# Patient Record
Sex: Female | Born: 1985 | Race: Black or African American | Hispanic: No | Marital: Single | Smoking: Current every day smoker
Health system: Southern US, Community
[De-identification: ages and names within clinical notes are randomized; demographics above are authoritative.]

## PROBLEM LIST (undated history)

## (undated) DIAGNOSIS — I10 Essential (primary) hypertension: Secondary | ICD-10-CM

---
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
## 2005-11-15 ENCOUNTER — Emergency Department (HOSPITAL_COMMUNITY): Admission: EM | Admit: 2005-11-15 | Discharge: 2005-11-15 | Payer: Self-pay | Admitting: Emergency Medicine

## 2005-11-16 ENCOUNTER — Emergency Department (HOSPITAL_COMMUNITY): Admission: EM | Admit: 2005-11-16 | Discharge: 2005-11-16 | Payer: Self-pay | Admitting: Emergency Medicine

## 2005-11-18 ENCOUNTER — Emergency Department (HOSPITAL_COMMUNITY): Admission: EM | Admit: 2005-11-18 | Discharge: 2005-11-18 | Payer: Self-pay | Admitting: Emergency Medicine

## 2005-11-19 ENCOUNTER — Emergency Department (HOSPITAL_COMMUNITY): Admission: EM | Admit: 2005-11-19 | Discharge: 2005-11-19 | Payer: Self-pay | Admitting: Emergency Medicine

## 2010-04-30 ENCOUNTER — Encounter: Payer: Self-pay | Admitting: Ophthalmology

## 2015-11-02 ENCOUNTER — Emergency Department (HOSPITAL_BASED_OUTPATIENT_CLINIC_OR_DEPARTMENT_OTHER): Payer: Self-pay

## 2015-11-02 ENCOUNTER — Emergency Department (HOSPITAL_BASED_OUTPATIENT_CLINIC_OR_DEPARTMENT_OTHER)
Admission: EM | Admit: 2015-11-02 | Discharge: 2015-11-02 | Disposition: A | Discharge status: Home / Self Care | Payer: Self-pay | Attending: Emergency Medicine | Admitting: Emergency Medicine

## 2015-11-02 ENCOUNTER — Encounter (HOSPITAL_BASED_OUTPATIENT_CLINIC_OR_DEPARTMENT_OTHER): Payer: Self-pay | Admitting: Emergency Medicine

## 2015-11-02 DIAGNOSIS — Y999 Unspecified external cause status: Secondary | ICD-10-CM | POA: Insufficient documentation

## 2015-11-02 DIAGNOSIS — F17200 Nicotine dependence, unspecified, uncomplicated: Secondary | ICD-10-CM | POA: Insufficient documentation

## 2015-11-02 DIAGNOSIS — IMO0002 Reserved for concepts with insufficient information to code with codable children: Secondary | ICD-10-CM

## 2015-11-02 DIAGNOSIS — Y929 Unspecified place or not applicable: Secondary | ICD-10-CM | POA: Insufficient documentation

## 2015-11-02 DIAGNOSIS — W268XXA Contact with other sharp object(s), not elsewhere classified, initial encounter: Secondary | ICD-10-CM | POA: Insufficient documentation

## 2015-11-02 DIAGNOSIS — Y9389 Activity, other specified: Secondary | ICD-10-CM | POA: Insufficient documentation

## 2015-11-02 DIAGNOSIS — S51012A Laceration without foreign body of left elbow, initial encounter: Secondary | ICD-10-CM | POA: Insufficient documentation

## 2015-11-02 MED ORDER — TETANUS-DIPHTH-ACELL PERTUSSIS 5-2.5-18.5 LF-MCG/0.5 IM SUSP
0.5000 mL | Freq: Once | INTRAMUSCULAR | Status: AC
  Administered 2015-11-02: 0.5 mL via INTRAMUSCULAR
  Filled 2015-11-02: qty 0.5

## 2015-11-02 MED ORDER — LIDOCAINE HCL 2 % IJ SOLN
10.0000 mL | Freq: Once | INTRAMUSCULAR | Status: DC
  Filled 2015-11-02: qty 20

## 2015-11-02 NOTE — ED Notes (Addendum)
Pt noted by Natalia Leatherwood, EMT to be ambulating from dept to the exit without being discharged. Pt did not stay for discharge papers, instructions, or follow-up information. No outstanding orders when pt left. Papers have been left at the charge desk should the pt return for them.

## 2015-11-02 NOTE — ED Triage Notes (Signed)
Pt has laceration to left forearm from hedge trimmers.

## 2015-11-02 NOTE — ED Provider Notes (Signed)
[facility]   CSN: [id number] Arrival date & time: 11/02/15  5436  First Provider Contact:  First MD Initiated Contact with Patient 11/02/15 2039      By signing my name below, I, [facility], attest that this documentation has been prepared under the direction and in the presence of Robynne Roat, PA-C. Electronically Signed: Randell Patient, ED Scribe. 11/02/15. 9:50 PM.   History   Chief Complaint Chief Complaint  Patient presents with  . Laceration    HPI Toni Hutchinson is a 30 y.o. female who presents to the Emergency Department complaining of a small, not actively bleeding, laceration to her left lateral elbow that occurred earlier today while at work. Pt states that she works in [employer] and was using a Publishing rights manager when it jerked and lacerated her left lateral elbow followed by mild bleeding. She notes that she was evaluated shortly PTA at [facility] where they stated that the laceration was not deep enough to warrant stitches. She reports mild numbness to the affected area. She has cleaned the wound and applied a bandage. Denies loss of ROM at the elbow, pain at the elbow, numbness of the forearm or hand, weakness of the hand or any other symptoms currently. She is unsure of her last tetanus.   The history is provided by the patient. No language interpreter was used.    History reviewed. No pertinent past medical history.  There are no active problems to display for this patient.   History reviewed. No pertinent surgical history.  OB History    No data available       Home Medications    Prior to Admission medications   Not on File    Family History No family history on file.  Social History Social History  Substance Use Topics  . Smoking status: Current Every Day Smoker  . Smokeless tobacco: Never Used  . Alcohol use Yes     Allergies   Review of patient's allergies indicates no  known allergies.   Review of Systems Review of Systems  Skin: Positive for wound.  Neurological: Positive for numbness.  All other systems reviewed and are negative.    Physical Exam Updated Vital Signs BP 135/78 (BP Location: Right Arm)   Pulse 106   Temp 98.3 F (36.8 C) (Oral)   Resp 18   Ht 5\' 6"  (1.676 m)   Wt 117.6 kg   SpO2 100%   BMI 41.84 kg/m   Physical Exam  Constitutional: She is oriented to person, place, and time. She appears well-developed and well-nourished. No distress.  HENT:  Head: Normocephalic and atraumatic.  Eyes: Conjunctivae are normal.  Neck: Normal range of motion.  Cardiovascular: Normal rate and intact distal pulses.   Radial pulse palpable  Pulmonary/Chest: Effort normal. No respiratory distress.  Musculoskeletal: Normal range of motion.       Left elbow: She exhibits laceration. She exhibits normal range of motion and no deformity. No tenderness found.       Arms: 3 cm linear laceration to the lateral elbow with a depth of approximately 1 cm. Fatty tissue exposed. No muscle, tendon or bone visualized. FROM of the elbow intact. No deformity of the elbow. Pronation and supination intact. FROM at the wrist and digits.   Neurological: She is alert and oriented to person, place, and time.  5/5 grip and elbow strength bilaterally. Sensation to light touch intact throughout.   Skin: Skin is  warm and dry.  Psychiatric: She has a normal mood and affect. Her behavior is normal.  Nursing note and vitals reviewed.    ED Treatments / Results  DIAGNOSTIC STUDIES: Oxygen Saturation is 100% on RA, normal by my interpretation.    COORDINATION OF CARE: 8:43 PM Will order x-ray of left elbow. Will return to perform laceration repair. Discussed treatment plan with pt at bedside and pt agreed to plan.  9:37 PM Returned to perform laceration repair on pt.  Radiology Dg Elbow Complete Left  Result Date: 11/02/2015 CLINICAL DATA:  Left elbow laceration  from hedge trimmer with pain, initial encounter EXAM: LEFT ELBOW - COMPLETE 3+ VIEW COMPARISON:  None. FINDINGS: Soft tissue defect is noted laterally consistent with the recent injury. No foreign body is seen. No underlying fracture or dislocation is noted. IMPRESSION: Soft tissue defect consistent with the recent injury. No acute bony abnormality is noted. Electronically Signed   By: Alcide Clever M.D.   On: 11/02/2015 21:26   Procedures .Marland KitchenLaceration Repair Date/Time: 11/02/2015 10:09 PM Performed by: Alveta Heimlich Authorized by: Alveta Heimlich   Consent:    Consent obtained:  Verbal   Consent given by:  Patient   Risks discussed:  Poor cosmetic result Anesthesia (see MAR for exact dosages):    Anesthesia method:  Local infiltration   Local anesthetic:  Lidocaine 2% w/o epi Laceration details:    Location:  Shoulder/arm   Shoulder/arm location:  L elbow   Length (cm):  3   Depth (mm):  10 Repair type:    Repair type:  Simple Pre-procedure details:    Preparation:  Patient was prepped and draped in usual sterile fashion and imaging obtained to evaluate for foreign bodies Exploration:    Wound exploration: entire depth of wound probed and visualized     Contaminated: no   Treatment:    Area cleansed with:  Saline   Amount of cleaning:  Standard   Irrigation solution:  Sterile saline   Irrigation method:  Syringe Skin repair:    Repair method:  Sutures   Suture size:  3-0   Suture material:  Prolene   Suture technique:  Simple interrupted   Number of sutures:  10 Approximation:    Approximation:  Close   Vermilion border: well-aligned   Post-procedure details:    Dressing:  Non-adherent dressing and antibiotic ointment   Patient tolerance of procedure:  Tolerated well, no immediate complications     Medications Ordered in ED Medications  lidocaine (XYLOCAINE) 2 % (with pres) injection 200 mg (not administered)  Tdap (BOOSTRIX) injection 0.5 mL (0.5 mLs Intramuscular  Given 11/02/15 2048)     Initial Impression / Assessment and Plan / ED Course  I have reviewed the triage vital signs and the nursing notes.  Pertinent labs & imaging results that were available during my care of the patient were reviewed by me and considered in my medical decision making (see chart for details).  Clinical Course    Pt presenting with laceration to left elbow. Left upper extremity is neurovascularly intact with FROM. Xray negative for fracture or foreign body. Pressure irrigation performed. Wound explored and base of wound visualized in a bloodless field without evidence of foreign body.  Laceration occurred < 8 hours prior to repair which was well tolerated. Tdap updated. Pt has no comorbidities to effect normal wound healing. Discussed suture home care with patient and answered questions. Pt to follow-up for wound check and suture removal in 7 days;  they are to return to the ED sooner for signs of infection. Pt is hemodynamically stable with no complaints prior to dc.   Final Clinical Impressions(s) / ED Diagnoses   Final diagnoses:  Laceration    New Prescriptions There are no discharge medications for this patient.  I personally performed the services described in this documentation, which was scribed in my presence. The recorded information has been reviewed and is accurate.     Rolm Gala Yer Castello, PA-C 11/03/15 0037    Rolland Porter, MD 11/16/15 [phone]

## 2015-11-02 NOTE — ED Notes (Signed)
Pt is asking how long she will have to be here, asking if we can skip the x-ray and do "some quick stitches so I can go". Pt informed Barrett, PA wants an x-ray before she will suture. Pt agreeable at this time.

## 2015-11-02 NOTE — ED Notes (Signed)
Pt standing in the doorway asking how long until she can get her sutures. Informed that Barrett, PA is busy with another pt and will see her for suture placement as soon as she is available.

## 2017-01-03 ENCOUNTER — Emergency Department (HOSPITAL_BASED_OUTPATIENT_CLINIC_OR_DEPARTMENT_OTHER)
Admission: EM | Admit: 2017-01-03 | Discharge: 2017-01-03 | Disposition: A | Discharge status: Home / Self Care | Payer: Self-pay | Attending: Emergency Medicine | Admitting: Emergency Medicine

## 2017-01-03 ENCOUNTER — Encounter (HOSPITAL_BASED_OUTPATIENT_CLINIC_OR_DEPARTMENT_OTHER): Payer: Self-pay

## 2017-01-03 DIAGNOSIS — Z5321 Procedure and treatment not carried out due to patient leaving prior to being seen by health care provider: Secondary | ICD-10-CM | POA: Insufficient documentation

## 2017-01-03 DIAGNOSIS — M791 Myalgia: Secondary | ICD-10-CM | POA: Insufficient documentation
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
## 2017-01-03 NOTE — ED Notes (Signed)
Attempted to asses pt, pt not in room at this time.

## 2017-01-03 NOTE — ED Notes (Signed)
Pt in room 7 visiting with friend, pt informed she needed to remain in her room so our staff could evaluate and monitor her.

## 2017-12-07 ENCOUNTER — Emergency Department (HOSPITAL_BASED_OUTPATIENT_CLINIC_OR_DEPARTMENT_OTHER)
Admission: EM | Admit: 2017-12-07 | Discharge: 2017-12-07 | Disposition: A | Discharge status: Home / Self Care | Payer: Self-pay | Attending: Emergency Medicine | Admitting: Emergency Medicine

## 2017-12-07 ENCOUNTER — Emergency Department (HOSPITAL_BASED_OUTPATIENT_CLINIC_OR_DEPARTMENT_OTHER): Payer: Self-pay

## 2017-12-07 ENCOUNTER — Other Ambulatory Visit: Payer: Self-pay

## 2017-12-07 ENCOUNTER — Encounter (HOSPITAL_BASED_OUTPATIENT_CLINIC_OR_DEPARTMENT_OTHER): Payer: Self-pay

## 2017-12-07 DIAGNOSIS — Y999 Unspecified external cause status: Secondary | ICD-10-CM | POA: Insufficient documentation

## 2017-12-07 DIAGNOSIS — Y92009 Unspecified place in unspecified non-institutional (private) residence as the place of occurrence of the external cause: Secondary | ICD-10-CM | POA: Insufficient documentation

## 2017-12-07 DIAGNOSIS — F17210 Nicotine dependence, cigarettes, uncomplicated: Secondary | ICD-10-CM | POA: Insufficient documentation

## 2017-12-07 DIAGNOSIS — Y939 Activity, unspecified: Secondary | ICD-10-CM | POA: Insufficient documentation

## 2017-12-07 DIAGNOSIS — W540XXA Bitten by dog, initial encounter: Secondary | ICD-10-CM

## 2017-12-07 DIAGNOSIS — S81851A Open bite, right lower leg, initial encounter: Secondary | ICD-10-CM | POA: Insufficient documentation

## 2017-12-07 DIAGNOSIS — I10 Essential (primary) hypertension: Secondary | ICD-10-CM | POA: Insufficient documentation

## 2017-12-07 HISTORY — DX: Essential (primary) hypertension: I10

## 2017-12-07 MED ORDER — AMOXICILLIN-POT CLAVULANATE 875-125 MG PO TABS
1.0000 | ORAL_TABLET | Freq: Once | ORAL | Status: AC
  Administered 2017-12-07: 1 via ORAL
  Filled 2017-12-07: qty 1

## 2017-12-07 MED ORDER — OXYCODONE-ACETAMINOPHEN 5-325 MG PO TABS
1.0000 | ORAL_TABLET | Freq: Once | ORAL | Status: AC
  Administered 2017-12-07: 1 via ORAL
  Filled 2017-12-07: qty 1

## 2017-12-07 MED ORDER — AMOXICILLIN-POT CLAVULANATE 875-125 MG PO TABS
1.0000 | ORAL_TABLET | Freq: Two times a day (BID) | ORAL | 0 refills | Status: AC
Start: 2017-12-07 — End: 2017-12-12

## 2017-12-07 NOTE — ED Triage Notes (Signed)
Pt reports multiple dog bite wounds to right LE-neighbor's dog-dog shots UTD-NAD-steady gait

## 2017-12-07 NOTE — ED Provider Notes (Signed)
[facility] EMERGENCY DEPARTMENT Provider Note   CSN: [id number] Arrival date & time: 12/07/17  1912     History   Chief Complaint Chief Complaint  Patient presents with  . Animal Bite    HPI Toni Hutchinson is a 32 y.o. female.  HPI   Toni SoDonica Friddle is a 32 year old female with a history of hypertension who presents to the emergency department for evaluation of dog bite.  Patient reports that she was attacked and bitten by her neighbor's dog about an hour prior to arrival.  She is sustained several puncture marks on her right leg, she denies wound elsewhere.  According to chart review her last tetanus vaccine was 10/2015.  She reports that the neighbor's dog is up-to-date on its immunizations.  Patient states that she has 10/10 severity pain over the wounds.  No medications prior to arrival.  She did not try to clean it prior to arrival.  She denies numbness, weakness, fever, chills.  She is able to ambulate independently.   Past Medical History:  Diagnosis Date  . Hypertension     There are no active problems to display for this patient.   History reviewed. No pertinent surgical history.   OB History   None      Home Medications    Prior to Admission medications   Not on File    Family History No family history on file.  Social History Social History   Tobacco Use  . Smoking status: Current Every Day Smoker    Types: Cigarettes  . Smokeless tobacco: Never Used  Substance Use Topics  . Alcohol use: No  . Drug use: Yes    Types: Marijuana     Allergies   Patient has no known allergies.   Review of Systems Review of Systems  Constitutional: Negative for chills and fever.  Musculoskeletal: Positive for myalgias (right leg). Negative for gait problem.  Skin: Positive for wound (puncture marks right leg).  Neurological: Negative for weakness and numbness.  All other systems reviewed and are negative.    Physical Exam Updated Vital  Signs BP (!) 125/92 (BP Location: Left Arm)   Pulse 87   Temp 99 F (37.2 C) (Oral)   Resp 20   Ht 5\' 7"  (1.702 m)   Wt 86.8 kg   LMP 11/09/2017 (Approximate)   SpO2 100%   BMI 29.98 kg/m   Physical Exam  Constitutional: She is oriented to person, place, and time. She appears well-developed and well-nourished. No distress.  Nontoxic-appearing.  HENT:  Head: Normocephalic and atraumatic.  Eyes: Right eye exhibits no discharge. Left eye exhibits no discharge.  Cardiovascular: Normal rate and regular rhythm.  Pulmonary/Chest: Effort normal. No respiratory distress.  Abdominal: Soft. There is no tenderness.  Musculoskeletal:  Right lower leg with 2 puncture marks with exposed adipose tissue.  There is overlying tenderness.  No surrounding erythema, warmth or induration.  No palpable foreign body.  See picture below.  Full knee and ankle range of motion.  DP pulses 2+ and symmetric bilaterally.  Sensation to light touch intact in bilateral lower extremities.  Neurological: She is alert and oriented to person, place, and time. Coordination normal.  Skin: Skin is warm and dry. Capillary refill takes less than 2 seconds. She is not diaphoretic.  Psychiatric: She has a normal mood and affect. Her behavior is normal.  Nursing note and vitals reviewed.   ED Treatments / Results  Labs (all labs ordered are listed, but only abnormal  results are displayed) Labs Reviewed - No data to display  EKG None  Radiology No results found.  Procedures Procedures (including critical care time)  Medications Ordered in ED Medications - No data to display   Initial Impression / Assessment and Plan / ED Course  I have reviewed the triage vital signs and the nursing notes.  Pertinent labs & imaging results that were available during my care of the patient were reviewed by me and considered in my medical decision making (see chart for details).     Presents after dog bite over right lower leg.  On exam she has two large puncture wounds with exposed adipose tissue. RLE neurovascularly intact. No signs of infection. Xray right tib/fib negative for acute abnormality or foreign body. Wounds irrigated extensively with sterile saline.  Have discussed with patient that closing the wound will increase risk of infection and plan to have wound closure by secondary intent.  Will discharge home with antibiotic prophylaxis given wounds are large and deep. Patient's tetanus vaccine is up to date. Patient filled out animal control paperwork in the ED for home visit and determination if rabies vaccine will be necessary.  Patient counseled on strict return precautions and she agrees and appears reliable.  Final Clinical Impressions(s) / ED Diagnoses   Final diagnoses:  Dog bite, initial encounter    ED Discharge Orders         Ordered    amoxicillin-clavulanate (AUGMENTIN) 875-125 MG tablet  Every 12 hours     12/07/17 2011           Lawrence Marseilles 12/07/17 2102    Terrilee Files, MD 12/08/17 1011

## 2017-12-07 NOTE — Discharge Instructions (Addendum)
Please take antibiotic twice a day for the next 5 days until it is completely gone.  Gently wash with warm soapy water daily and keep covered with clean dressings.  It is important to look out for signs of infection like worsening redness and swelling around the bite mark or if you have a fever.  If this happens, you will need to return to the emergency department immediately.  Follow-up with animal control.

## 2019-01-10 IMAGING — CR DG TIBIA/FIBULA 2V*R*
4 series · 4 of 4 positions shown · non-contrast
Comparison: None.

CLINICAL DATA: Dog bite

EXAM:
RIGHT TIBIA AND FIBULA - 2 VIEW

[t tib/fib ap right (1 of 2)]
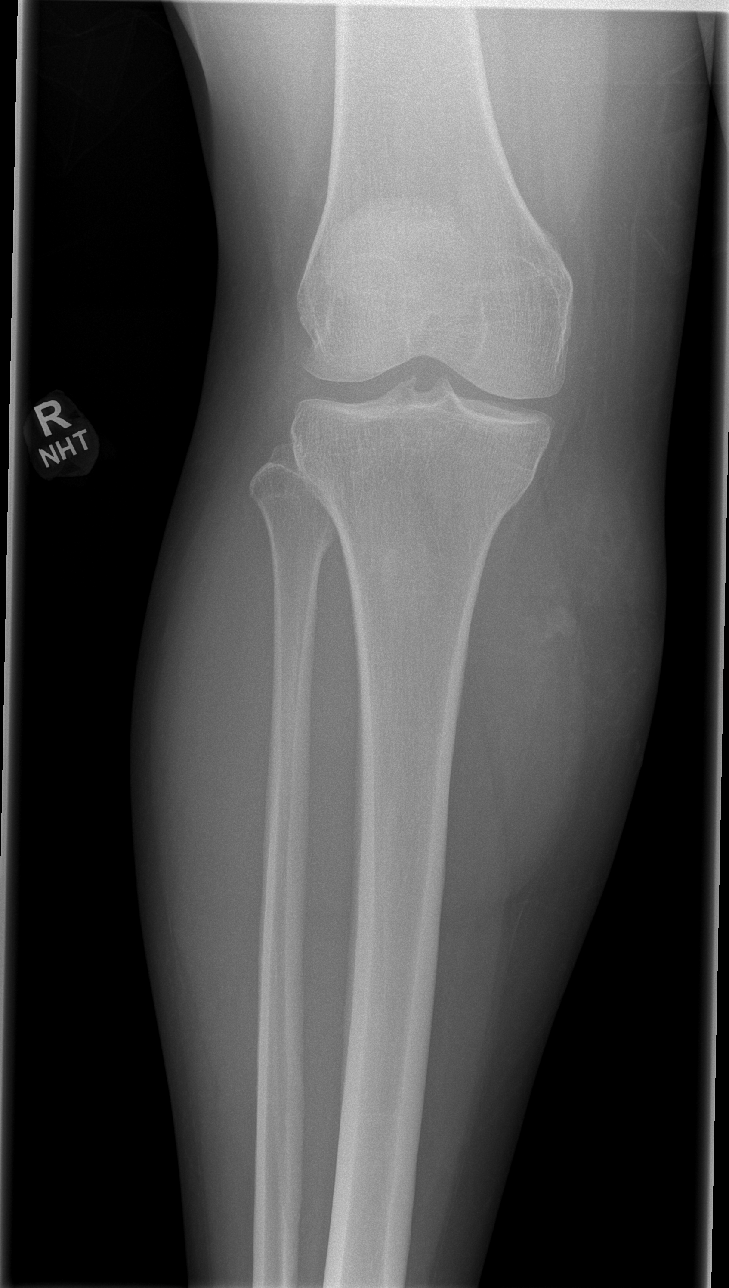

[t tib/fib ap right (2 of 2)]
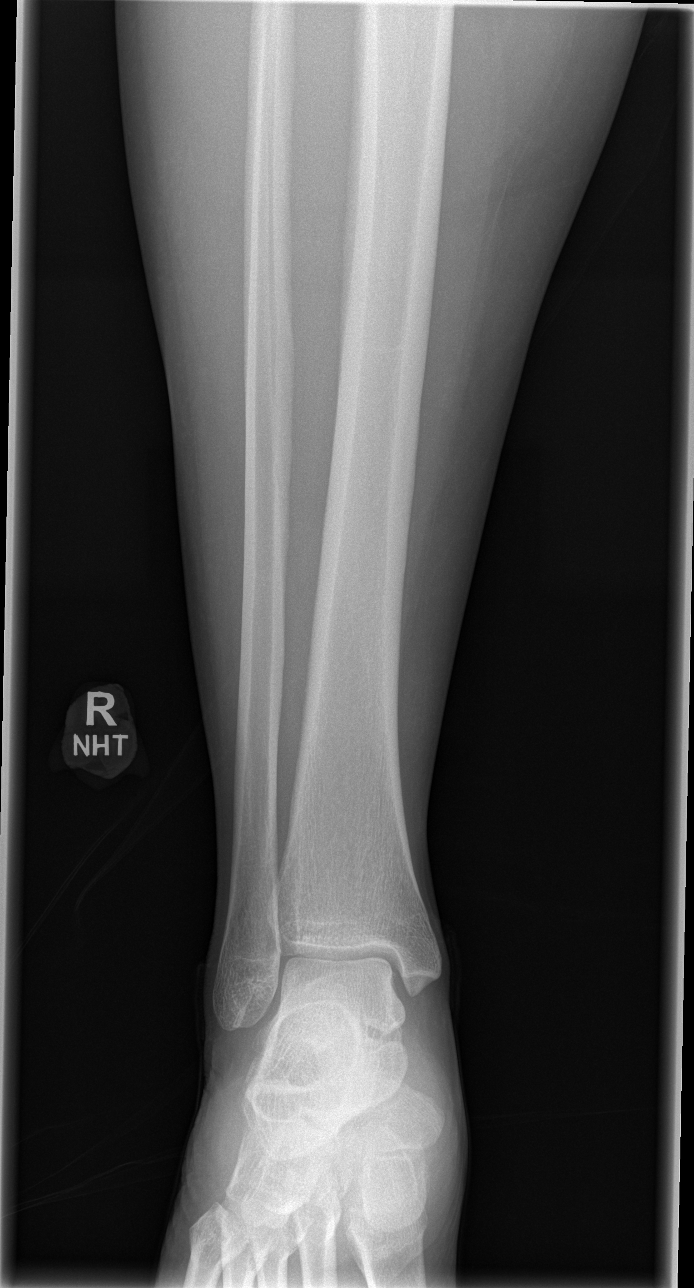

[t tib/fib lat right (1 of 2)]
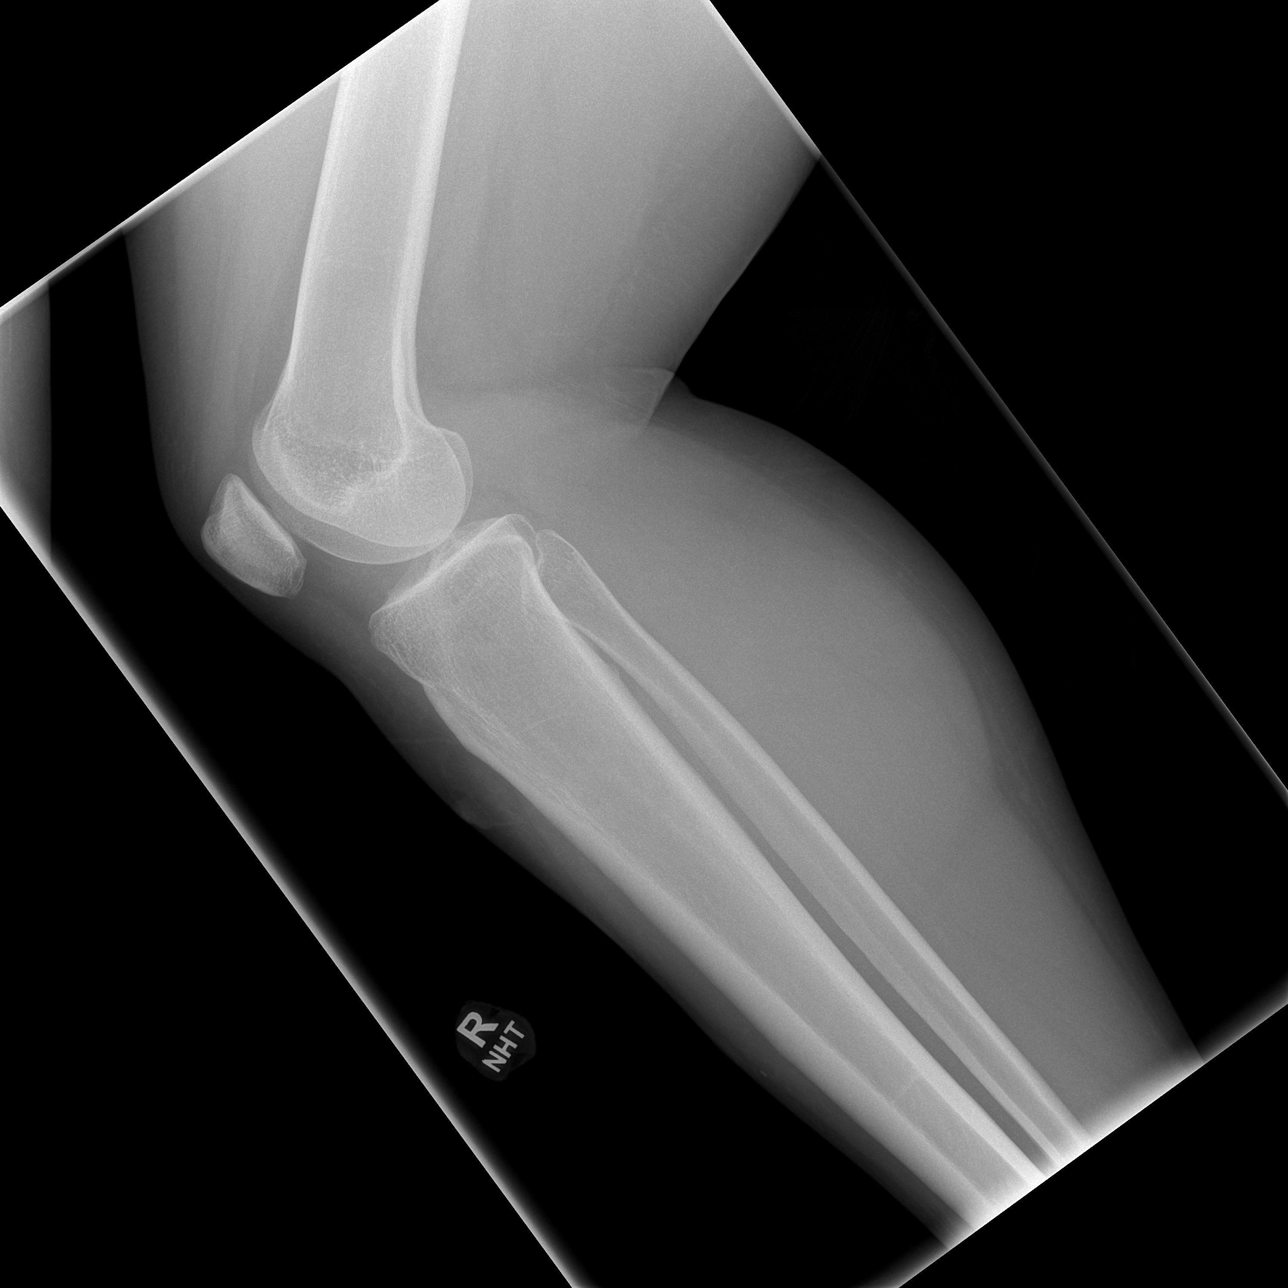

[t tib/fib lat right (2 of 2)]
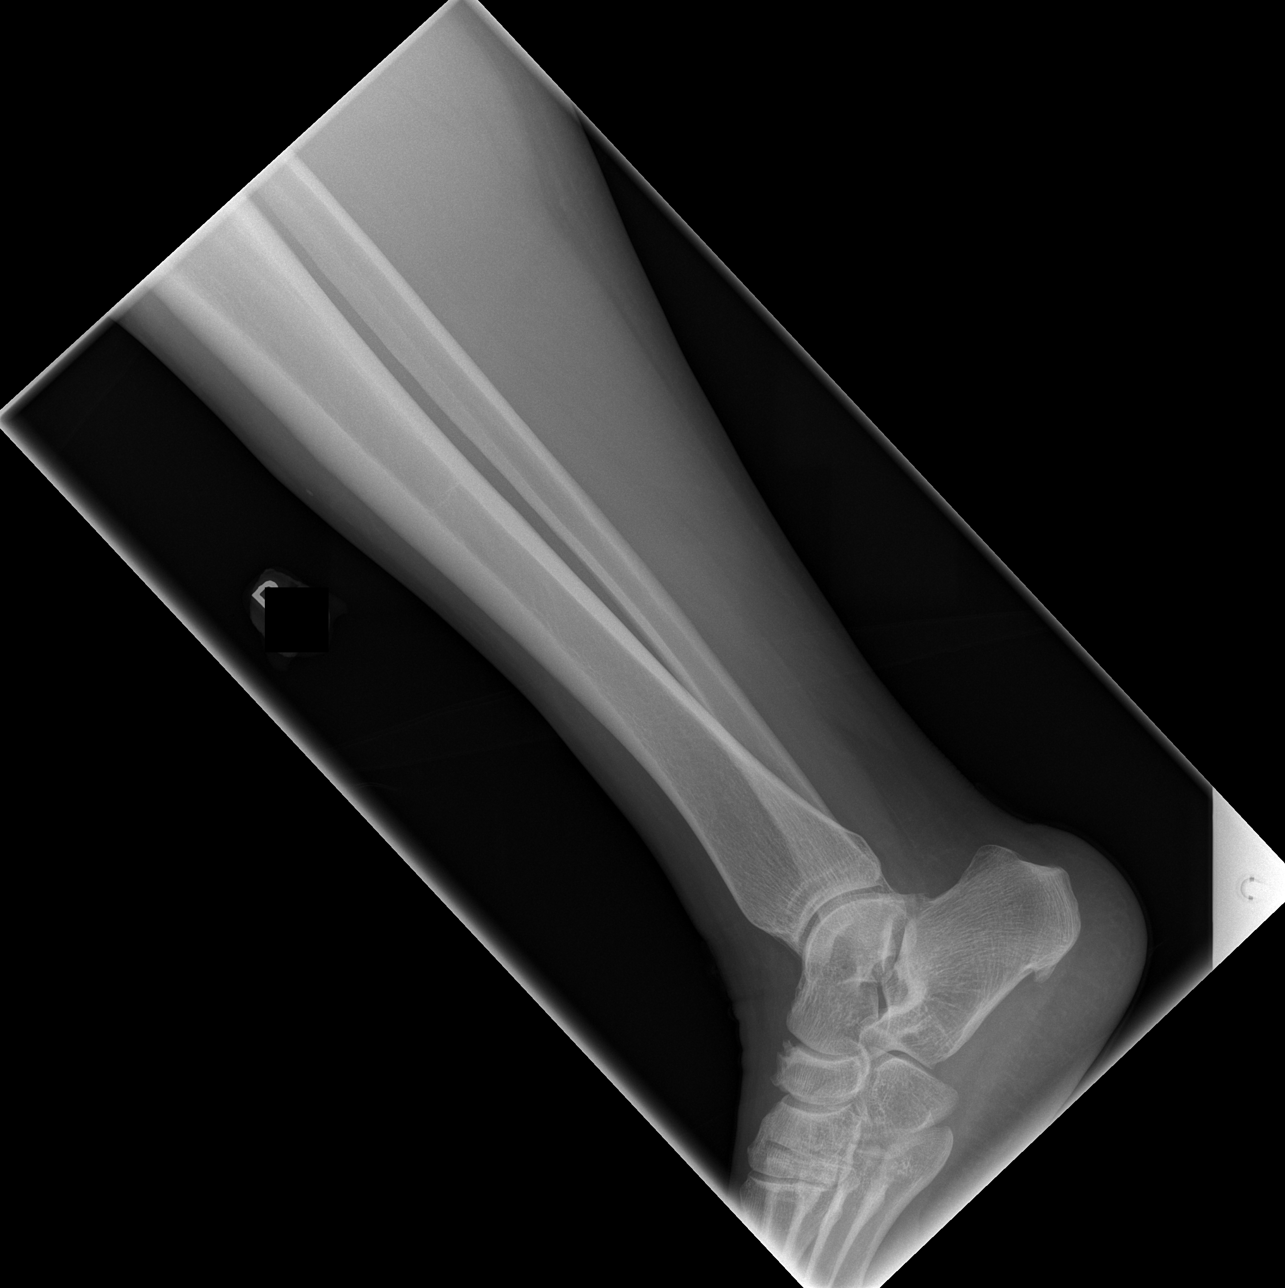

[4 of 4 positions shown; findings below may reference images not displayed]

FINDINGS: There is no acute osseous abnormality. There is no retained
radiopaque foreign body. No soft tissue emphysema.
IMPRESSION: No osseous abnormality or radiopaque foreign body.
